# Patient Record
Sex: Female | Born: 1985
Health system: Southern US, Community
[De-identification: ages and names within clinical notes are randomized; demographics above are authoritative.]

## PROBLEM LIST (undated history)

## (undated) DIAGNOSIS — M419 Scoliosis, unspecified: Secondary | ICD-10-CM

## (undated) DIAGNOSIS — M797 Fibromyalgia: Secondary | ICD-10-CM

---
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
## 2005-03-26 ENCOUNTER — Emergency Department: Payer: Self-pay | Admitting: Unknown Physician Specialty

## 2005-06-14 ENCOUNTER — Emergency Department: Payer: Self-pay | Admitting: Emergency Medicine

## 2005-12-17 ENCOUNTER — Emergency Department: Payer: Self-pay | Admitting: Emergency Medicine

## 2006-03-17 ENCOUNTER — Emergency Department: Payer: Self-pay | Admitting: Emergency Medicine

## 2006-05-21 ENCOUNTER — Observation Stay: Payer: Self-pay | Admitting: Obstetrics and Gynecology

## 2006-06-05 ENCOUNTER — Inpatient Hospital Stay: Payer: Self-pay

## 2006-06-15 ENCOUNTER — Emergency Department: Payer: Self-pay | Admitting: Emergency Medicine

## 2006-06-16 ENCOUNTER — Ambulatory Visit: Payer: Self-pay | Admitting: Anesthesiology

## 2006-07-15 ENCOUNTER — Ambulatory Visit: Payer: Self-pay | Admitting: Anesthesiology

## 2007-06-30 ENCOUNTER — Emergency Department: Payer: Self-pay | Admitting: Emergency Medicine

## 2007-09-30 ENCOUNTER — Emergency Department: Payer: Self-pay | Admitting: Emergency Medicine

## 2008-03-05 ENCOUNTER — Emergency Department: Payer: Self-pay | Admitting: Emergency Medicine

## 2010-01-09 ENCOUNTER — Emergency Department: Payer: Self-pay | Admitting: Emergency Medicine

## 2010-08-09 ENCOUNTER — Emergency Department: Payer: Self-pay | Admitting: Emergency Medicine

## 2010-11-28 ENCOUNTER — Emergency Department: Payer: Self-pay | Admitting: Emergency Medicine

## 2012-09-22 ENCOUNTER — Inpatient Hospital Stay: Payer: Self-pay

## 2012-09-22 LAB — CBC WITH DIFFERENTIAL/PLATELET
Basophil #: 0 10*3/uL (ref 0.0–0.1)
Basophil %: 0.4 %
Eosinophil #: 0.1 10*3/uL (ref 0.0–0.7)
Eosinophil %: 0.9 %
HCT: 34.2 % — ABNORMAL LOW (ref 35.0–47.0)
HGB: 11.4 g/dL — ABNORMAL LOW (ref 12.0–16.0)
Lymphocyte #: 3.2 10*3/uL (ref 1.0–3.6)
MCH: 31 pg (ref 26.0–34.0)
MCV: 93 fL (ref 80–100)
Monocyte #: 1 x10 3/mm — ABNORMAL HIGH (ref 0.2–0.9)
Monocyte %: 9.2 %
Neutrophil #: 6.7 10*3/uL — ABNORMAL HIGH (ref 1.4–6.5)
Neutrophil %: 60.3 %
Platelet: 292 10*3/uL (ref 150–440)
RDW: 13.6 % (ref 11.5–14.5)

## 2013-11-27 ENCOUNTER — Emergency Department: Payer: Self-pay | Admitting: Emergency Medicine

## 2014-05-13 ENCOUNTER — Emergency Department: Payer: Self-pay | Admitting: Emergency Medicine

## 2015-12-20 ENCOUNTER — Other Ambulatory Visit: Payer: Self-pay | Admitting: Internal Medicine

## 2015-12-20 DIAGNOSIS — M25562 Pain in left knee: Secondary | ICD-10-CM

## 2016-01-09 ENCOUNTER — Ambulatory Visit
Admission: RE | Admit: 2016-01-09 | Discharge: 2016-01-09 | Disposition: A | Discharge status: Home / Self Care | Payer: 59 | Source: Ambulatory Visit | Attending: Internal Medicine | Admitting: Internal Medicine

## 2016-01-09 DIAGNOSIS — M2242 Chondromalacia patellae, left knee: Secondary | ICD-10-CM | POA: Insufficient documentation

## 2016-01-09 DIAGNOSIS — M25462 Effusion, left knee: Secondary | ICD-10-CM | POA: Diagnosis not present

## 2016-01-09 DIAGNOSIS — G8929 Other chronic pain: Secondary | ICD-10-CM | POA: Insufficient documentation

## 2016-01-09 DIAGNOSIS — M25562 Pain in left knee: Secondary | ICD-10-CM | POA: Diagnosis present

## 2019-08-12 ENCOUNTER — Encounter: Payer: Self-pay | Admitting: Intensive Care

## 2019-08-12 ENCOUNTER — Emergency Department: Payer: 59

## 2019-08-12 ENCOUNTER — Other Ambulatory Visit: Payer: Self-pay

## 2019-08-12 ENCOUNTER — Emergency Department
Admission: EM | Admit: 2019-08-12 | Discharge: 2019-08-12 | Disposition: A | Discharge status: Home / Self Care | Payer: 59 | Attending: Emergency Medicine | Admitting: Emergency Medicine

## 2019-08-12 DIAGNOSIS — U071 COVID-19: Secondary | ICD-10-CM | POA: Diagnosis not present

## 2019-08-12 DIAGNOSIS — F17290 Nicotine dependence, other tobacco product, uncomplicated: Secondary | ICD-10-CM | POA: Insufficient documentation

## 2019-08-12 DIAGNOSIS — R0602 Shortness of breath: Secondary | ICD-10-CM

## 2019-08-12 HISTORY — DX: Fibromyalgia: M79.7

## 2019-08-12 HISTORY — DX: Scoliosis, unspecified: M41.9

## 2019-08-12 MED ORDER — ALBUTEROL SULFATE (2.5 MG/3ML) 0.083% IN NEBU
5.0000 mg | INHALATION_SOLUTION | Freq: Once | RESPIRATORY_TRACT | Status: AC
  Administered 2019-08-12: 5 mg via RESPIRATORY_TRACT
  Filled 2019-08-12: qty 6

## 2019-08-12 MED ORDER — METHYLPREDNISOLONE SODIUM SUCC 125 MG IJ SOLR
125.0000 mg | Freq: Once | INTRAMUSCULAR | Status: AC
  Administered 2019-08-12: 125 mg via INTRAMUSCULAR
  Filled 2019-08-12: qty 2

## 2019-08-12 MED ORDER — ALBUTEROL SULFATE HFA 108 (90 BASE) MCG/ACT IN AERS: 2.0000 | INHALATION_SPRAY | RESPIRATORY_TRACT | 0 refills | Status: DC | PRN

## 2019-08-12 MED ORDER — PREDNISONE 50 MG PO TABS: 50.0000 mg | ORAL_TABLET | Freq: Every day | ORAL | 0 refills | Status: DC

## 2019-08-12 NOTE — ED Notes (Signed)
See triage note  Presents with some SOB  States she was dx'd with COVID about 9 days ago  States no fever or other sx's except for runny nose  States she developed sob breathe couple of days ago  Unable to speak full sentences  resp labored

## 2019-08-12 NOTE — ED Provider Notes (Signed)
[facility] Emergency Department Provider Note  ____________________________________________  Time seen: Approximately 3:13 PM  I have reviewed the triage vital signs and the nursing notes.   HISTORY  Chief Complaint Shortness of Breath    HPI Angelica Larson is a 33 y.o. female who presents the emergency department complaining of shortness of breath.  Patient was diagnosed with COVID-19 10 days ago.  She states that overall she has had very mild symptoms with some mild nasal congestion, occasional cough.  Patient has not had fevers or chills, chest pain, shortness of breath, domino pain, nausea vomiting over the past 10 days.  Patient reports over the last 2 days she is developed increasing shortness of breath.  She was more concerned today as she got up, walked to her house to the kitchen.  She states by the time she arrived in her kitchen she was so short of breath she had to sit down.  She states that she was taking in deep breaths she felt lightheaded but did not have any syncopal episode.  Patient states that at rest she has very minimal shortness of breath but with any exertion she has increasing shortness of breath and then will feel dizzy again.  No significant chronic medical history.  Patient has taken no medications for his complaint prior to arrival.         Past Medical History:  Diagnosis Date  . Fibromyalgia   . Scoliosis     There are no active problems to display for this patient.   History reviewed. No pertinent surgical history.  Prior to Admission medications   Medication Sig Start Date End Date Taking? Authorizing Provider  albuterol (VENTOLIN HFA) 108 (90 Base) MCG/ACT inhaler Inhale 2 puffs into the lungs every 4 (four) hours as needed for wheezing or shortness of breath. 08/12/19   Devanny Palecek, Delorise RoyalsJonathan D, PA-C  predniSONE (DELTASONE) 50 MG tablet Take 1 tablet (50 mg total) by mouth daily with breakfast. 08/12/19   Cataldo Cosgriff,  Delorise RoyalsJonathan D, PA-C    Allergies Patient has no known allergies.  History reviewed. No pertinent family history.  Social History Social History   Tobacco Use  . Smoking status: Current Every Day Smoker    Types: E-cigarettes  . Smokeless tobacco: Never Used  Substance Use Topics  . Alcohol use: Not Currently    Frequency: Never  . Drug use: Never     Review of Systems  Constitutional: No fever/chills Eyes: No visual changes. No discharge ENT: Mild nasal congestion Cardiovascular: no chest pain. Respiratory: Positive cough.  Increasing SOB. Gastrointestinal: No abdominal pain.  No nausea, no vomiting.  No diarrhea.  No constipation. Musculoskeletal: Negative for musculoskeletal pain. Skin: Negative for rash, abrasions, lacerations, ecchymosis. Neurological: Negative for headaches, focal weakness or numbness. 10-point ROS otherwise negative.  ____________________________________________   PHYSICAL EXAM:  VITAL SIGNS: ED Triage Vitals  Enc Vitals Group     BP 08/12/19 1415 110/67     Pulse Rate 08/12/19 1415 74     Resp 08/12/19 1415 16     Temp 08/12/19 1415 98.9 F (37.2 C)     Temp Source 08/12/19 1415 Oral     SpO2 08/12/19 1415 100 %     Weight 08/12/19 1417 185 lb (83.9 kg)     Height 08/12/19 1417 5\' 4"  (1.626 m)     Head Circumference --      Peak Flow --      Pain Score 08/12/19 1417 0  Pain Loc --      Pain Edu? --      Excl. in GC? --      Constitutional: Alert and oriented. Well appearing and in no acute distress. Eyes: Conjunctivae are normal. PERRL. EOMI. Head: Atraumatic. ENT:      Ears:       Nose: Minimal clear congestion/rhinnorhea.      Mouth/Throat: Mucous membranes are moist.  Oropharynx is nonerythematous and nonedematous.  Use midline. Neck: No stridor.  Hematological/Lymphatic/Immunilogical: No cervical lymphadenopathy. Cardiovascular: Normal rate, regular rhythm. Normal S1 and S2.  Good peripheral circulation. Respiratory:  Increased respiratory effort without tachypnea or retractions.  Patient is able to complete short sentences but is unable to complete long sentences without taking a breath.  Lungs CTAB with no appreciable wheezing rales or rhonchi.Peri Jefferson air entry to the bases with no decreased or absent breath sounds. Gastrointestinal: Bowel sounds 4 quadrants. Soft and nontender to palpation. No guarding or rigidity. No palpable masses. No distention. No CVA tenderness. Musculoskeletal: Full range of motion to all extremities. No gross deformities appreciated. Neurologic:  Normal speech and language. No gross focal neurologic deficits are appreciated.  Skin:  Skin is warm, dry and intact. No rash noted. Psychiatric: Mood and affect are normal. Speech and behavior are normal. Patient exhibits appropriate insight and judgement.   ____________________________________________   LABS (all labs ordered are listed, but only abnormal results are displayed)  Labs Reviewed - No data to display ____________________________________________  EKG   ____________________________________________  RADIOLOGY I personally viewed and evaluated these images as part of my medical decision making, as well as reviewing the written report by the radiologist.  Dg Chest 1 View  Result Date: 08/12/2019 CLINICAL DATA:  Shortness of breath.  COVID-19. EXAM: CHEST  1 VIEW COMPARISON:  11/28/2010 FINDINGS: The heart size and mediastinal contours are within normal limits. Both lungs are clear. The visualized skeletal structures are unremarkable. IMPRESSION: Normal exam. Electronically Signed   By: Francene Boyers M.D.   On: 08/12/2019 14:54    ____________________________________________    PROCEDURES  Procedure(s) performed:    Procedures    Medications  methylPREDNISolone sodium succinate (SOLU-MEDROL) 125 mg/2 mL injection 125 mg (125 mg Intramuscular Given 08/12/19 1536)  albuterol (PROVENTIL) (2.5 MG/3ML) 0.083%  nebulizer solution 5 mg (5 mg Nebulization Given 08/12/19 1536)     ____________________________________________   INITIAL IMPRESSION / ASSESSMENT AND PLAN / ED COURSE  Pertinent labs & imaging results that were available during my care of the patient were reviewed by me and considered in my medical decision making (see chart for details).  Review of the [place] CSRS was performed in accordance of the [facility] prior to dispensing any controlled drugs.  Clinical Course as of Aug 11 1658  Fri Aug 12, 2019  1656 Reevaluation of the patient after Solu-Medrol and albuterol reveals good improvement in symptoms.  Patient states that she feels "much better."  Patient is completing sentences at this time with no increased work of breathing.  Patient's lung sounds were clear prior to medications, remain clear after medication treatment.  However again, patient appears much more comfortable and reports great symptomatic improvement.  At this time, patient's vital signs are stable.  I feel patient is stable for discharge.   [JC]    Clinical Course User Index [JC] Laelani Vasko, Delorise Royals, PA-C          Patient's diagnosis is consistent with COVID-19, shortness of breath.  Patient presented to emergency department with  increased exertional shortness of breath.  Patient has been diagnosed with COVID-19 10 days prior.  Patient had been relatively asymptomatic until the last 2 days which she experienced an increase in shortness of breath.  Today patient was walking through her house, felt very short of breath to the point of feeling near syncopal.  Initially, patient was able to talk and 2 or 3 word sentences but longer sentences required a breath in the middle of a sentence.  Patient was given albuterol, Solu-Medrol here in the emergency department.  Patient had a significant improvement in her symptoms, states that her shortness of breath feeling has resolved.  Patient is able to complete full sentences at this time  with no difficulty.  Vital signs have been stable throughout ED course.  At this time, chest x-ray reveals no consolidation concerning for bacterial pneumonia on top of COVID-19.  I will prescribe prednisone, albuterol for at home use.  Return precautions are discussed with the patient.  At this time no indication for further work-up.  Patient is stable for discharge..  Follow-up primary care as needed. patient is given ED precautions to return to the ED for any worsening or new symptoms.     ____________________________________________  FINAL CLINICAL IMPRESSION(S) / ED DIAGNOSES  Final diagnoses:  COVID-19  SOB (shortness of breath)      NEW MEDICATIONS STARTED DURING THIS VISIT:  ED Discharge Orders         Ordered    predniSONE (DELTASONE) 50 MG tablet  Daily with breakfast     08/12/19 1659    albuterol (VENTOLIN HFA) 108 (90 Base) MCG/ACT inhaler  Every 4 hours PRN     08/12/19 1659              This chart was dictated using voice recognition software/Dragon. Despite best efforts to proofread, errors can occur which can change the meaning. Any change was purely unintentional.    Darletta Moll, PA-C 08/12/19 1659    Duffy Bruce, MD 08/14/19 [phone]

## 2019-08-12 NOTE — ED Notes (Signed)
Flex ok per MD Corky Downs

## 2019-08-12 NOTE — ED Triage Notes (Signed)
Presents with SOB. Covid positive 08/03/19. Reports she cannot have a long conversation or exertion without sob

## 2019-12-12 ENCOUNTER — Ambulatory Visit: Payer: 59 | Admitting: Obstetrics and Gynecology

## 2019-12-30 ENCOUNTER — Other Ambulatory Visit: Payer: Self-pay

## 2019-12-30 ENCOUNTER — Encounter: Payer: Self-pay | Admitting: Obstetrics & Gynecology

## 2019-12-30 ENCOUNTER — Other Ambulatory Visit (HOSPITAL_COMMUNITY)
Admission: RE | Admit: 2019-12-30 | Discharge: 2019-12-30 | Disposition: A | Discharge status: Home / Self Care | Payer: 59 | Source: Ambulatory Visit | Attending: Obstetrics & Gynecology | Admitting: Obstetrics & Gynecology

## 2019-12-30 ENCOUNTER — Ambulatory Visit (INDEPENDENT_AMBULATORY_CARE_PROVIDER_SITE_OTHER): Payer: 59 | Admitting: Obstetrics & Gynecology

## 2019-12-30 VITALS — BP 100/60 | Ht 64.0 in | Wt 195.0 lb

## 2019-12-30 DIAGNOSIS — Z124 Encounter for screening for malignant neoplasm of cervix: Secondary | ICD-10-CM | POA: Insufficient documentation

## 2019-12-30 DIAGNOSIS — Z30433 Encounter for removal and reinsertion of intrauterine contraceptive device: Secondary | ICD-10-CM

## 2019-12-30 DIAGNOSIS — Z01419 Encounter for gynecological examination (general) (routine) without abnormal findings: Secondary | ICD-10-CM

## 2019-12-30 NOTE — Progress Notes (Signed)
HPI:      [name] is a 34 y.o. [phone] who LMP was No LMP recorded. (Menstrual status: IUD)., she presents today for her annual examination. The patient has no complaints today. The patient is sexually active. Her last pap: patient does not recall when last pap was and it was normal. The patient does perform self breast exams.  There is no notable family history of breast or ovarian cancer in her family.  The patient has regular exercise: yes.  The patient denies current symptoms of depression.    GYN History: Contraception: IUD  Has kids 7 and 85 yo.  IUD after each, current IUD 34 yrs old.  PMHx: Past Medical History:  Diagnosis Date  . Fibromyalgia   . Scoliosis    History reviewed. No pertinent surgical history. History reviewed. No pertinent family history. Social History   Tobacco Use  . Smoking status: Current Every Day Smoker    Types: E-cigarettes  . Smokeless tobacco: Never Used  Substance Use Topics  . Alcohol use: Not Currently  . Drug use: Never   No current outpatient medications on file. Allergies: Patient has no known allergies.  Review of Systems  Constitutional: Negative for chills, fever and malaise/fatigue.  HENT: Negative for congestion, sinus pain and sore throat.   Eyes: Negative for blurred vision and pain.  Respiratory: Negative for cough and wheezing.   Cardiovascular: Negative for chest pain and leg swelling.  Gastrointestinal: Negative for abdominal pain, constipation, diarrhea, heartburn, nausea and vomiting.  Genitourinary: Negative for dysuria, frequency, hematuria and urgency.  Musculoskeletal: Positive for joint pain. Negative for back pain, myalgias and neck pain.  Skin: Negative for itching and rash.  Neurological: Negative for dizziness, tremors and weakness.  Endo/Heme/Allergies: Does not bruise/bleed easily.  Psychiatric/Behavioral: Negative for depression. The patient is not nervous/anxious and does not have insomnia.      Objective: BP 100/60   Ht 5\' 4"  (1.626 m)   Wt 195 lb (88.5 kg)   BMI 33.47 kg/m   Filed Weights   12/30/19 1441  Weight: 195 lb (88.5 kg)   Body mass index is 33.47 kg/m. Physical Exam Constitutional:      General: She is not in acute distress.    Appearance: She is well-developed.  Genitourinary:     Pelvic exam was performed with patient supine.     Vagina, uterus and rectum normal.     No lesions in the vagina.     No vaginal bleeding.     No cervical motion tenderness, friability, lesion or polyp.     IUD strings visualized.     Uterus is mobile.     Uterus is not enlarged.     No uterine mass detected.    Uterus is midaxial.     No right or left adnexal mass present.     Right adnexa not tender.     Left adnexa not tender.  HENT:     Head: Normocephalic and atraumatic. No laceration.     Right Ear: Hearing normal.     Left Ear: Hearing normal.     Mouth/Throat:     Pharynx: Uvula midline.  Eyes:     Pupils: Pupils are equal, round, and reactive to light.  Neck:     Thyroid: No thyromegaly.  Cardiovascular:     Rate and Rhythm: Normal rate and regular rhythm.     Heart sounds: No murmur. No friction rub. No gallop.   Pulmonary:  Effort: Pulmonary effort is normal. No respiratory distress.     Breath sounds: Normal breath sounds. No wheezing.  Chest:     Breasts:        Right: No mass, skin change or tenderness.        Left: No mass, skin change or tenderness.  Abdominal:     General: Bowel sounds are normal. There is no distension.     Palpations: Abdomen is soft.     Tenderness: There is no abdominal tenderness. There is no rebound.  Musculoskeletal:        General: Normal range of motion.     Cervical back: Normal range of motion and neck supple.  Neurological:     Mental Status: She is alert and oriented to person, place, and time.     Cranial Nerves: No cranial nerve deficit.  Skin:    General: Skin is warm and dry.  Psychiatric:         Judgment: Judgment normal.  Vitals reviewed.     Assessment:  ANNUAL EXAM 1. Encounter for removal and reinsertion of intrauterine contraceptive device   2. Women's annual routine gynecological examination   3. Screening for cervical cancer      Screening Plan:            1.  Cervical Screening-  Pap smear done today  2. Breast screening- Exam annually and mammogram>40 planned   3. Colonoscopy every 10 years, Hemoccult testing - after age 57  4. Labs managed by PCP  5. Counseling for contraception: IUD   Encounter for removal and reinsertion of intrauterine contraceptive device exchange today   IUD PROCEDURE NOTE:  Angelica Larson is a 34 y.o. [id number] here for IUD removal and re-insertion. No GYN concerns.   Pelvic exam:  Two IUD strings present seen coming from the cervical os. EGBUS, vaginal vault and cervix: within normal limits  IUD Removal Strings of IUD identified and grasped.  IUD removed without problem.  Pt tolerated this well.  IUD noted to be intact.  IUD Insertion Procedure Note Patient identified, informed consent performed, consent signed.   Discussed risks of irregular bleeding, cramping, infection, malpositioning or misplacement of the IUD outside the uterus which may require further procedure such as laparoscopy, risk of failure <1%. Time out was performed.   A bimanual exam showed the uterus to be midposition.  Speculum placed in the vagina.  Cervix visualized.  Cleaned with Betadine x 2.  Grasped anteriorly with a single tooth tenaculum.  Uterus sounded to 8 cm.   Mirena IUD placed per manufacturer's recommendations.  Strings trimmed to 3 cm. Tenaculum was removed, good hemostasis noted.  Patient tolerated procedure well.   Patient was given post-procedure instructions.  She was advised to have backup contraception for one week.  Patient was also asked to check IUD strings periodically and follow up in 4 weeks for IUD check.  Barnett Applebaum, MD, [facility], [facility] 12/30/2019  3:24 PM    F/U  Return in about 4 weeks (around 01/27/2020) for Follow up.  Barnett Applebaum, MD, [facility], [facility] 12/30/2019  3:24 PM

## 2019-12-30 NOTE — Patient Instructions (Signed)

## 2020-01-04 LAB — CYTOLOGY - PAP
Comment: NEGATIVE
Diagnosis: NEGATIVE
High risk HPV: NEGATIVE

## 2020-01-27 ENCOUNTER — Ambulatory Visit (INDEPENDENT_AMBULATORY_CARE_PROVIDER_SITE_OTHER): Payer: 59 | Admitting: Obstetrics & Gynecology

## 2020-01-27 ENCOUNTER — Encounter: Payer: Self-pay | Admitting: Obstetrics & Gynecology

## 2020-01-27 ENCOUNTER — Other Ambulatory Visit: Payer: Self-pay

## 2020-01-27 VITALS — BP 120/80 | Ht 64.0 in | Wt 188.0 lb

## 2020-01-27 DIAGNOSIS — G4719 Other hypersomnia: Secondary | ICD-10-CM

## 2020-01-27 DIAGNOSIS — R5382 Chronic fatigue, unspecified: Secondary | ICD-10-CM | POA: Diagnosis not present

## 2020-01-27 DIAGNOSIS — Z30431 Encounter for routine checking of intrauterine contraceptive device: Secondary | ICD-10-CM | POA: Diagnosis not present

## 2020-01-27 NOTE — Progress Notes (Signed)
  History of Present Illness:  Angelica Larson is a 34 y.o. that had a Mirena IUD removed and replaced (due to reaching its expiration) approximately 1 month ago. Since that time, she states that she has had no bleeding and pain  PMHx: She  has a past medical history of Fibromyalgia and Scoliosis. Also,  has no past surgical history on file., family history is not on file.,  reports that she has been smoking e-cigarettes. She has never used smokeless tobacco. She reports previous alcohol use. She reports that she does not use drugs. Current Meds  Medication Sig  . levonorgestrel (MIRENA) 20 MCG/24HR IUD 1 each by Intrauterine route once.  .  Also, has No Known Allergies..  Review of Systems  All other systems reviewed and are negative.   Physical Exam:  BP 120/80   Ht 5\' 4"  (1.626 m)   Wt 188 lb (85.3 kg)   BMI 32.27 kg/m  Body mass index is 32.27 kg/m. Constitutional: Well nourished, well developed female in no acute distress.  Abdomen: diffusely non tender to palpation, non distended, and no masses, hernias Neuro: Grossly intact Psych:  Normal mood and affect.    Pelvic exam:  Two IUD strings present seen coming from the cervical os. EGBUS, vaginal vault and cervix: within normal limits  Assessment: IUD strings present in proper location; pt doing well  Plan: She was told to continue to use barrier contraception, in order to prevent any STIs, and to take a home pregnancy test or call if she ever thinks she may be pregnant, and that her IUD expires in 6 years.  Pt also reports worsening fatigue, especially over the last month.  Has always had sleep issues and insomnia.  Has had sleep studies in past.  Snores, but did not have dx of sleep apnea.  Will check CBC and TSH today.  May need to see ENT again in this regards.  She was amenable to this plan and we will see her back in 1 year/PRN.  A total of 20 minutes were spent face-to-face with the patient as well as  preparation, review, communication, and documentation during this encounter.   [place], MD, [facility], [facility] 01/27/2020  2:26 PM

## 2020-01-28 LAB — CBC
Hematocrit: 39.8 % (ref 34.0–46.6)
Hemoglobin: 13.4 g/dL (ref 11.1–15.9)
MCH: 32.2 pg (ref 26.6–33.0)
MCHC: 33.7 g/dL (ref 31.5–35.7)
MCV: 96 fL (ref 79–97)
Platelets: 367 10*3/uL (ref 150–450)
RBC: 4.16 x10E6/uL (ref 3.77–5.28)
RDW: 12.6 % (ref 11.7–15.4)
WBC: 8.2 10*3/uL (ref 3.4–10.8)

## 2020-01-28 LAB — TSH: TSH: 1.05 u[IU]/mL (ref 0.450–4.500)

## 2020-01-30 NOTE — Progress Notes (Signed)
Let her know CBC and Thyroid labs normal, not an indicator of why she has continued fatigue.

## 2020-06-05 IMAGING — CR DG CHEST 1V
1 series · 1 of 1 positions shown · non-contrast
Comparison: 11/28/2010

CLINICAL DATA: Shortness of breath.  [id number].

EXAM:
CHEST  1 VIEW

[x chest ap]
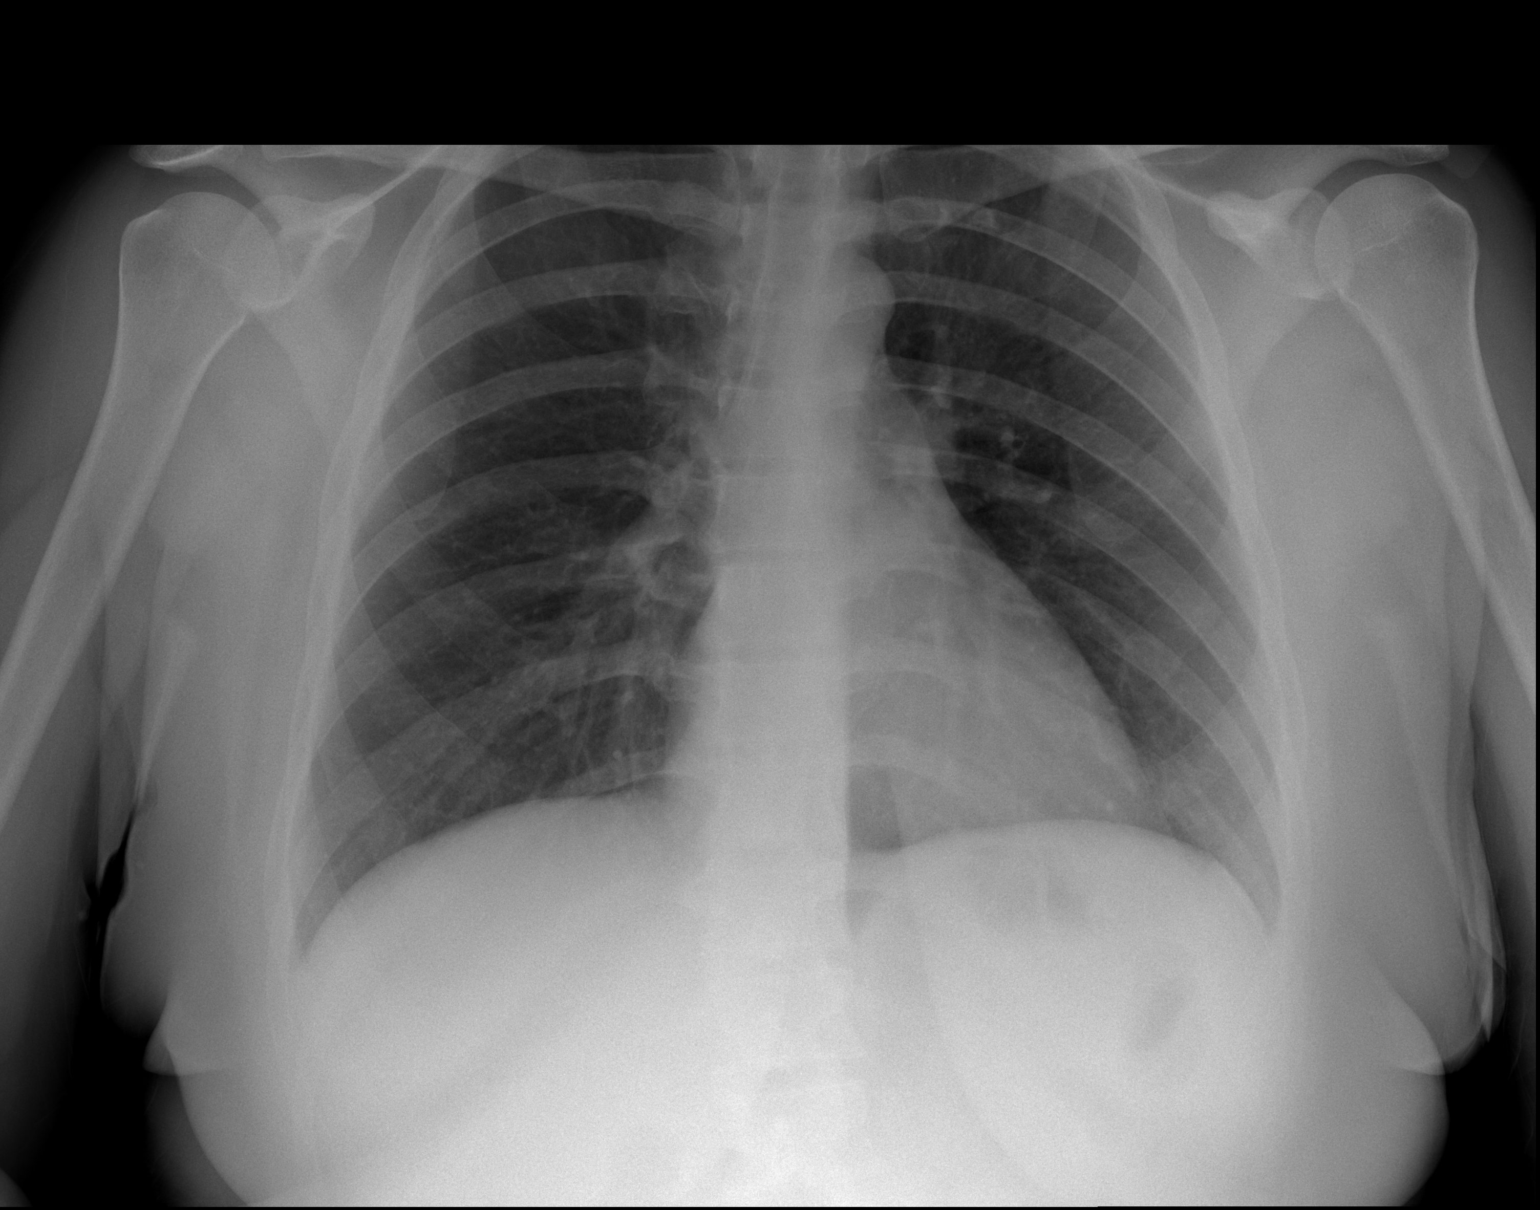

[1 of 1 positions shown; findings below may reference images not displayed]

FINDINGS: The heart size and mediastinal contours are within normal limits.
Both lungs are clear. The visualized skeletal structures are
unremarkable.
IMPRESSION: Normal exam.

## 2021-05-29 ENCOUNTER — Encounter: Payer: Self-pay | Admitting: Emergency Medicine

## 2021-05-29 ENCOUNTER — Other Ambulatory Visit: Payer: Self-pay

## 2021-05-29 ENCOUNTER — Emergency Department
Admission: EM | Admit: 2021-05-29 | Discharge: 2021-05-29 | Disposition: A | Discharge status: Home / Self Care | Payer: 59 | Attending: Emergency Medicine | Admitting: Emergency Medicine

## 2021-05-29 DIAGNOSIS — J020 Streptococcal pharyngitis: Secondary | ICD-10-CM | POA: Insufficient documentation

## 2021-05-29 DIAGNOSIS — F17290 Nicotine dependence, other tobacco product, uncomplicated: Secondary | ICD-10-CM | POA: Insufficient documentation

## 2021-05-29 LAB — GROUP A STREP BY PCR: Group A Strep by PCR: DETECTED — AB

## 2021-05-29 LAB — CBC WITH DIFFERENTIAL/PLATELET
Abs Immature Granulocytes: 0.07 10*3/uL (ref 0.00–0.07)
Basophils Absolute: 0 10*3/uL (ref 0.0–0.1)
Basophils Relative: 0 %
Eosinophils Absolute: 0.1 10*3/uL (ref 0.0–0.5)
Eosinophils Relative: 1 %
HCT: 34 % — ABNORMAL LOW (ref 36.0–46.0)
Hemoglobin: 11.7 g/dL — ABNORMAL LOW (ref 12.0–15.0)
Immature Granulocytes: 1 %
Lymphocytes Relative: 12 %
Lymphs Abs: 1.4 10*3/uL (ref 0.7–4.0)
MCH: 33.3 pg (ref 26.0–34.0)
MCHC: 34.4 g/dL (ref 30.0–36.0)
MCV: 96.9 fL (ref 80.0–100.0)
Monocytes Absolute: 0.9 10*3/uL (ref 0.1–1.0)
Monocytes Relative: 7 %
Neutro Abs: 9.5 10*3/uL — ABNORMAL HIGH (ref 1.7–7.7)
Neutrophils Relative %: 79 %
Platelets: 366 10*3/uL (ref 150–400)
RBC: 3.51 MIL/uL — ABNORMAL LOW (ref 3.87–5.11)
RDW: 14 % (ref 11.5–15.5)
WBC: 12 10*3/uL — ABNORMAL HIGH (ref 4.0–10.5)
nRBC: 0 % (ref 0.0–0.2)

## 2021-05-29 LAB — MONONUCLEOSIS SCREEN: Mono Screen: NEGATIVE

## 2021-05-29 MED ORDER — AMOXICILLIN 400 MG/5ML PO SUSR: 875.0000 mg | Freq: Two times a day (BID) | ORAL | 0 refills | Status: AC

## 2021-05-29 NOTE — ED Triage Notes (Signed)
C/O fever yesterday, COVID test negative.  Today c.o sore throat, right side worse than left.  AAOx3.  Skin warm and dry. NAD, Voice clear and strong.

## 2021-05-29 NOTE — ED Provider Notes (Signed)
[facility] Emergency Department Provider Note  ____________________________________________   Event Date/Time   First MD Initiated Contact with Patient 05/29/21 [id number]     (approximate)  I have reviewed the triage vital signs and the nursing notes.   HISTORY  Chief Complaint Sore Throat    HPI Angelica Larson is a 35 y.o. female presents emergency department complaining of sore throat and fever.  Symptoms started yesterday.  Patient took a home COVID test yesterday but it was negative.  States today the sore throat has gotten worse.  Worse on the right side than the left.  Past Medical History:  Diagnosis Date   Fibromyalgia    Scoliosis     There are no problems to display for this patient.   History reviewed. No pertinent surgical history.  Prior to Admission medications   Medication Sig Start Date End Date Taking? Authorizing Provider  amoxicillin (AMOXIL) 400 MG/5ML suspension Take 10.9 mLs (875 mg total) by mouth 2 (two) times daily for 10 days. Discard remainder 05/29/21 06/08/21 Yes Bowie Doiron, Roselyn Bering, PA-C  levonorgestrel (MIRENA) 20 MCG/24HR IUD 1 each by Intrauterine route once.    [provider]    Allergies Patient has no known allergies.  History reviewed. No pertinent family history.  Social History Social History   Tobacco Use   Smoking status: Every Day    Types: E-cigarettes   Smokeless tobacco: Never  Vaping Use   Vaping Use: Every day  Substance Use Topics   Alcohol use: Not Currently   Drug use: Never    Review of Systems  Constitutional: No fever/chills Eyes: No visual changes. ENT: Positive sore throat. Respiratory: Denies cough Cardiovascular: Denies chest pain Gastrointestinal: Denies abdominal pain Genitourinary: Negative for dysuria. Musculoskeletal: Negative for back pain. Skin: Negative for rash. Psychiatric: no mood changes,      ____________________________________________   PHYSICAL EXAM:  VITAL SIGNS: ED Triage Vitals  Enc Vitals Group     BP 05/29/21 0806 118/62     Pulse Rate 05/29/21 0812 88     Resp 05/29/21 0806 18     Temp 05/29/21 0806 98.4 F (36.9 C)     Temp Source 05/29/21 0806 Oral     SpO2 05/29/21 0812 100 %     Weight 05/29/21 0801 188 lb 0.8 oz (85.3 kg)     Height 05/29/21 0801 5\' 4"  (1.626 m)     Head Circumference --      Peak Flow --      Pain Score 05/29/21 0801 8     Pain Loc --      Pain Edu? --      Excl. in GC? --     Constitutional: Alert and oriented. Well appearing and in no acute distress. Eyes: Conjunctivae are normal.  Head: Atraumatic. Nose: No congestion/rhinnorhea. Mouth/Throat: Mucous membranes are moist.  Throat is red and swollen, right tonsil is slightly larger than the left Neck:  supple no lymphadenopathy noted Cardiovascular: Normal rate, regular rhythm. Heart sounds are normal GU: deferred Musculoskeletal: FROM all extremities, warm and well perfused Neurologic:  Normal speech and language.  Skin:  Skin is warm, dry and intact. No rash noted. Psychiatric: Mood and affect are normal. Speech and behavior are normal.  ____________________________________________   LABS (all labs ordered are listed, but only abnormal results are displayed)  Labs Reviewed  GROUP A STREP BY PCR - Abnormal; Notable for the following components:      Result Value  Group A Strep by PCR DETECTED (*)    All other components within normal limits  CBC WITH DIFFERENTIAL/PLATELET - Abnormal; Notable for the following components:   WBC 12.0 (*)    RBC 3.51 (*)    Hemoglobin 11.7 (*)    HCT 34.0 (*)    Neutro Abs 9.5 (*)    All other components within normal limits  MONONUCLEOSIS SCREEN    ____________________________________________   ____________________________________________  RADIOLOGY    ____________________________________________   PROCEDURES  Procedure(s) performed: No  Procedures    ____________________________________________   INITIAL IMPRESSION / ASSESSMENT AND PLAN / ED COURSE  Pertinent labs & imaging results that were available during my care of the patient were reviewed by me and considered in my medical decision making (see chart for details).   Patient is 35 year old female presents with sore throat.  See HPI.  Physical exam shows throat to be red and swollen.  Concerns for strep versus mono.  Patient does not exhibit COVID-like symptoms.  Strep test, monotest, CBC ordered  Strep test is positive, mono is negative, CBC has mild elevation in WBCs.  I did explain all the findings to the patient.  She is unsure if she will be able to swallow pills so I did prescribe liquid amoxicillin.  She is to gargle with warm salt water.  Take Tylenol or ibuprofen for pain as needed.  Return emergency department if worsening.  She was discharged in stable condition.  Angelica Larson was evaluated in Emergency Department on 05/29/2021 for the symptoms described in the history of present illness. She was evaluated in the context of the global COVID-19 pandemic, which necessitated consideration that the patient might be at risk for infection with the SARS-CoV-2 virus that causes COVID-19. Institutional protocols and algorithms that pertain to the evaluation of patients at risk for COVID-19 are in a state of rapid change based on information released by regulatory bodies including the CDC and federal and state organizations. These policies and algorithms were followed during the patient's care in the ED.    As part of my medical decision making, I reviewed the following data within the electronic MEDICAL RECORD NUMBER Nursing notes reviewed and incorporated, Labs  reviewed , Old chart reviewed, Notes from prior ED visits, and [place] Controlled Substance Database  ____________________________________________   FINAL CLINICAL IMPRESSION(S) / ED DIAGNOSES  Final diagnoses:  Acute streptococcal pharyngitis      NEW MEDICATIONS STARTED DURING THIS VISIT:  New Prescriptions   AMOXICILLIN (AMOXIL) 400 MG/5ML SUSPENSION    Take 10.9 mLs (875 mg total) by mouth 2 (two) times daily for 10 days. Discard remainder     Note:  This document was prepared using Dragon voice recognition software and may include unintentional dictation errors.    Faythe Ghee, PA-C 05/29/21 1005    Gilles Chiquito, MD 05/29/21 1455

## 2021-05-29 NOTE — ED Notes (Signed)
See triage note  Presents with sore throat and fever  States sx's started yesterday  Had fever yesterday but is currently afebrile on arrival   COVID test was negative

## 2021-05-29 NOTE — Discharge Instructions (Addendum)
Follow-up with regular doctor if not improving in 2 to 3 days.  Gargle with warm salt water.  Take your antibiotic as prescribed.  Tylenol and ibuprofen for pain as needed.  Return if worsening
